# Patient Record
Sex: Female | Born: 1950 | Race: White | Hispanic: No | Smoking: Current every day smoker
Health system: Southern US, Community
[De-identification: ages and names within clinical notes are randomized; demographics above are authoritative.]

## PROBLEM LIST (undated history)

## (undated) DIAGNOSIS — F32A Depression, unspecified: Secondary | ICD-10-CM

## (undated) DIAGNOSIS — R32 Unspecified urinary incontinence: Secondary | ICD-10-CM

## (undated) DIAGNOSIS — Z8701 Personal history of pneumonia (recurrent): Secondary | ICD-10-CM

## (undated) DIAGNOSIS — F329 Major depressive disorder, single episode, unspecified: Secondary | ICD-10-CM

## (undated) DIAGNOSIS — F419 Anxiety disorder, unspecified: Secondary | ICD-10-CM

## (undated) DIAGNOSIS — R112 Nausea with vomiting, unspecified: Secondary | ICD-10-CM

## (undated) DIAGNOSIS — E119 Type 2 diabetes mellitus without complications: Secondary | ICD-10-CM

## (undated) DIAGNOSIS — I10 Essential (primary) hypertension: Secondary | ICD-10-CM

## (undated) DIAGNOSIS — K219 Gastro-esophageal reflux disease without esophagitis: Secondary | ICD-10-CM

## (undated) DIAGNOSIS — Z9889 Other specified postprocedural states: Secondary | ICD-10-CM

## (undated) DIAGNOSIS — R002 Palpitations: Secondary | ICD-10-CM

## (undated) HISTORY — DX: Depression, unspecified: F32.A

## (undated) HISTORY — DX: Unspecified urinary incontinence: R32

## (undated) HISTORY — DX: Major depressive disorder, single episode, unspecified: F32.9

---
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
## 1979-01-09 HISTORY — PX: DILATION AND CURETTAGE OF UTERUS: SHX78

## 1981-05-10 HISTORY — PX: ABDOMINAL HYSTERECTOMY: SHX81

## 2012-08-30 ENCOUNTER — Other Ambulatory Visit: Payer: Self-pay | Admitting: Neurosurgery

## 2012-09-07 ENCOUNTER — Encounter (HOSPITAL_COMMUNITY): Payer: Self-pay

## 2012-09-07 ENCOUNTER — Encounter (HOSPITAL_COMMUNITY)
Admission: RE | Admit: 2012-09-07 | Discharge: 2012-09-07 | Disposition: A | Discharge status: Home / Self Care | Payer: No Typology Code available for payment source | Source: Ambulatory Visit | Attending: Neurosurgery | Admitting: Neurosurgery

## 2012-09-07 ENCOUNTER — Ambulatory Visit (HOSPITAL_COMMUNITY)
Admission: RE | Admit: 2012-09-07 | Discharge: 2012-09-07 | Disposition: A | Discharge status: Home / Self Care | Payer: No Typology Code available for payment source | Source: Ambulatory Visit | Attending: Anesthesiology | Admitting: Anesthesiology

## 2012-09-07 ENCOUNTER — Encounter (HOSPITAL_COMMUNITY): Payer: Self-pay | Admitting: Pharmacy Technician

## 2012-09-07 DIAGNOSIS — Z87891 Personal history of nicotine dependence: Secondary | ICD-10-CM | POA: Insufficient documentation

## 2012-09-07 DIAGNOSIS — I10 Essential (primary) hypertension: Secondary | ICD-10-CM | POA: Insufficient documentation

## 2012-09-07 DIAGNOSIS — Z9119 Patient's noncompliance with other medical treatment and regimen: Secondary | ICD-10-CM | POA: Insufficient documentation

## 2012-09-07 DIAGNOSIS — Z91199 Patient's noncompliance with other medical treatment and regimen due to unspecified reason: Secondary | ICD-10-CM | POA: Insufficient documentation

## 2012-09-07 DIAGNOSIS — Z01818 Encounter for other preprocedural examination: Secondary | ICD-10-CM | POA: Insufficient documentation

## 2012-09-07 DIAGNOSIS — I77819 Aortic ectasia, unspecified site: Secondary | ICD-10-CM | POA: Insufficient documentation

## 2012-09-07 DIAGNOSIS — Z01812 Encounter for preprocedural laboratory examination: Secondary | ICD-10-CM | POA: Insufficient documentation

## 2012-09-07 HISTORY — DX: Palpitations: R00.2

## 2012-09-07 HISTORY — DX: Type 2 diabetes mellitus without complications: E11.9

## 2012-09-07 HISTORY — DX: Essential (primary) hypertension: I10

## 2012-09-07 HISTORY — DX: Personal history of pneumonia (recurrent): Z87.01

## 2012-09-07 HISTORY — DX: Anxiety disorder, unspecified: F41.9

## 2012-09-07 HISTORY — DX: Nausea with vomiting, unspecified: R11.2

## 2012-09-07 HISTORY — DX: Other specified postprocedural states: Z98.890

## 2012-09-07 HISTORY — DX: Gastro-esophageal reflux disease without esophagitis: K21.9

## 2012-09-07 LAB — SURGICAL PCR SCREEN
MRSA, PCR: NEGATIVE
Staphylococcus aureus: NEGATIVE

## 2012-09-07 LAB — CBC
MCV: 82.6 fL (ref 78.0–100.0)
Platelets: 343 10*3/uL (ref 150–400)
RBC: 5.47 MIL/uL — ABNORMAL HIGH (ref 3.87–5.11)
RDW: 13.2 % (ref 11.5–15.5)
WBC: 15.5 10*3/uL — ABNORMAL HIGH (ref 4.0–10.5)

## 2012-09-07 LAB — BASIC METABOLIC PANEL
CO2: 31 mEq/L (ref 19–32)
Calcium: 9.9 mg/dL (ref 8.4–10.5)
Creatinine, Ser: 0.99 mg/dL (ref 0.50–1.10)
GFR calc non Af Amer: 61 mL/min — ABNORMAL LOW (ref >90)
Sodium: 138 mEq/L (ref 135–145)

## 2012-09-07 NOTE — Pre-Procedure Instructions (Signed)
Katrina Schultz  09/07/2012   Your procedure is scheduled on:  May 6  Report to [facility] at 05:30 AM.  Call this number if you have problems the morning of surgery: [phone]   Remember:   Do not eat food or drink liquids after midnight.   Take these medicines the morning of surgery with A SIP OF WATER: Lorazepam (if needed); Hydrocodone (if needed) Celexa   STOP Belviq today  Do not wear jewelry, make-up or nail polish.  Do not wear lotions, powders, or perfumes. You may wear deodorant.  Do not shave 48 hours prior to surgery. Men may shave face and neck.  Do not bring valuables to the hospital.  Contacts, dentures or bridgework may not be worn into surgery.  Leave suitcase in the car. After surgery it may be brought to your room.  For patients admitted to the hospital, checkout time is 11:00 AM the day of discharge.   Special Instructions: Shower using CHG 2 nights before surgery and the night before surgery.  If you shower the day of surgery use CHG.  Use special wash - you have one bottle of CHG for all showers.  You should use approximately 1/3 of the bottle for each shower.   Please read over the following fact sheets that you were given: Pain Booklet, Coughing and Deep Breathing and Surgical Site Infection Prevention

## 2012-09-07 NOTE — Progress Notes (Signed)
Requested Stress test, EKG, Echo, OV from Dr. Earna Coder cardiologist [place], [place]

## 2012-09-08 NOTE — Progress Notes (Signed)
Anesthesia Chart Review:  Patient is a 62 year old female scheduled for C4-5, C5-6, C6-7 ACDF on 09/12/12.  History includes former smoker, DM2, HTN, anxiety, GERD, palpitations, PNA, tonsillectomy, hysterectomy, hard of hearing (lip reads very proficiently per PAT RN notes).    She was evaluated by Cardiologist is Dr. Phylliss Bob in [place], [place] in late 2013 for chest pain.  She had a normal stress echo on 04/10/12, and continued medical therapy was recommended.  Echo on 04/05/12 showed normal LV function, EF 60%, normal RV function, mild TR.  Holter in 03/30/12 showed NSR with PACs.    EKG on 03/27/12 (Dr. Rockne Menghini) showed NSR, borderline LAD.  CXR on 09/07/12 showed no acute cardiopulmonary or pleural abnormalities seen.  Preoperative labs noted.  WBC elevated at 15.5K.  Cr 0.99, glucose 166. I called WBC result to Erie Noe at Dr. Trudee Grip office.  I will order a repeat CBC with differential for the day of surgery.  Erie Noe will let me know if Dr. Beverlyn Roux has any additional pre-operative order recommendations.  If her follow-up WBC is stable/improved then I would anticipate she could proceed as planned.   Velna Ochs [facility]/Anesthesiology Phone [phone] 09/08/2012 11:42 AM

## 2012-09-11 MED ORDER — CEFAZOLIN SODIUM-DEXTROSE 2-3 GM-% IV SOLR
2.0000 g | INTRAVENOUS | Status: DC
  Filled 2012-09-11: qty 50

## 2012-09-11 MED ORDER — DEXAMETHASONE SODIUM PHOSPHATE 10 MG/ML IJ SOLN
10.0000 mg | INTRAMUSCULAR | Status: AC
  Administered 2012-09-12: 10 mg via INTRAVENOUS
  Filled 2012-09-11: qty 1

## 2012-09-12 ENCOUNTER — Encounter (HOSPITAL_COMMUNITY): Payer: Self-pay | Admitting: *Deleted

## 2012-09-12 ENCOUNTER — Inpatient Hospital Stay (HOSPITAL_COMMUNITY)
Admission: RE | Admit: 2012-09-12 | Discharge: 2012-09-14 | DRG: 472 | Disposition: A | Discharge status: Home / Self Care | Payer: No Typology Code available for payment source | Source: Ambulatory Visit | Attending: Neurosurgery | Admitting: Neurosurgery

## 2012-09-12 ENCOUNTER — Encounter (HOSPITAL_COMMUNITY): Payer: Self-pay | Admitting: Vascular Surgery

## 2012-09-12 ENCOUNTER — Inpatient Hospital Stay (HOSPITAL_COMMUNITY): Payer: No Typology Code available for payment source

## 2012-09-12 ENCOUNTER — Inpatient Hospital Stay (HOSPITAL_COMMUNITY): Payer: No Typology Code available for payment source | Admitting: Anesthesiology

## 2012-09-12 ENCOUNTER — Encounter (HOSPITAL_COMMUNITY)
Admission: RE | Disposition: A | Discharge status: Home / Self Care | Payer: Self-pay | Source: Ambulatory Visit | Attending: Neurosurgery

## 2012-09-12 DIAGNOSIS — M4802 Spinal stenosis, cervical region: Secondary | ICD-10-CM

## 2012-09-12 DIAGNOSIS — Z87891 Personal history of nicotine dependence: Secondary | ICD-10-CM

## 2012-09-12 DIAGNOSIS — F411 Generalized anxiety disorder: Secondary | ICD-10-CM | POA: Diagnosis present

## 2012-09-12 DIAGNOSIS — I10 Essential (primary) hypertension: Secondary | ICD-10-CM | POA: Diagnosis present

## 2012-09-12 DIAGNOSIS — Z794 Long term (current) use of insulin: Secondary | ICD-10-CM

## 2012-09-12 DIAGNOSIS — M47812 Spondylosis without myelopathy or radiculopathy, cervical region: Principal | ICD-10-CM | POA: Diagnosis present

## 2012-09-12 DIAGNOSIS — K219 Gastro-esophageal reflux disease without esophagitis: Secondary | ICD-10-CM | POA: Diagnosis present

## 2012-09-12 DIAGNOSIS — N390 Urinary tract infection, site not specified: Secondary | ICD-10-CM | POA: Diagnosis present

## 2012-09-12 DIAGNOSIS — E119 Type 2 diabetes mellitus without complications: Secondary | ICD-10-CM | POA: Diagnosis present

## 2012-09-12 HISTORY — PX: ANTERIOR CERVICAL DECOMP/DISCECTOMY FUSION: SHX1161

## 2012-09-12 LAB — DIFFERENTIAL
Basophils Absolute: 0 10*3/uL (ref 0.0–0.1)
Basophils Relative: 0 % (ref 0–1)
Lymphocytes Relative: 35 % (ref 12–46)
Monocytes Relative: 10 % (ref 3–12)
Neutro Abs: 5.5 10*3/uL (ref 1.7–7.7)
Neutrophils Relative %: 53 % (ref 43–77)

## 2012-09-12 LAB — GLUCOSE, CAPILLARY
Glucose-Capillary: 212 mg/dL — ABNORMAL HIGH (ref 70–99)
Glucose-Capillary: 243 mg/dL — ABNORMAL HIGH (ref 70–99)
Glucose-Capillary: 276 mg/dL — ABNORMAL HIGH (ref 70–99)
Glucose-Capillary: 282 mg/dL — ABNORMAL HIGH (ref 70–99)
Glucose-Capillary: 241 mg/dL — ABNORMAL HIGH (ref 70–99)

## 2012-09-12 LAB — CBC
HCT: 41.1 % (ref 36.0–46.0)
Hemoglobin: 14.7 g/dL (ref 12.0–15.0)
MCHC: 35.8 g/dL (ref 30.0–36.0)
RDW: 13.1 % (ref 11.5–15.5)
WBC: 10.3 10*3/uL (ref 4.0–10.5)

## 2012-09-12 SURGERY — ANTERIOR CERVICAL DECOMPRESSION/DISCECTOMY FUSION 3 LEVELS
Anesthesia: General | Wound class: Clean

## 2012-09-12 MED ORDER — MENTHOL 3 MG MT LOZG
1.0000 | LOZENGE | OROMUCOSAL | Status: DC | PRN
  Filled 2012-09-12: qty 9

## 2012-09-12 MED ORDER — HEMOSTATIC AGENTS (NO CHARGE) OPTIME
TOPICAL | Status: DC | PRN
  Administered 2012-09-12: 1 via TOPICAL

## 2012-09-12 MED ORDER — BACITRACIN 50000 UNITS IM SOLR
INTRAMUSCULAR | Status: AC
  Filled 2012-09-12: qty 1

## 2012-09-12 MED ORDER — LORAZEPAM 0.5 MG PO TABS: 0.5000 mg | ORAL_TABLET | Freq: Four times a day (QID) | ORAL | Status: DC | PRN

## 2012-09-12 MED ORDER — SODIUM CHLORIDE 0.9 % IV SOLN
INTRAVENOUS | Status: AC
  Filled 2012-09-12: qty 500

## 2012-09-12 MED ORDER — FENTANYL CITRATE 0.05 MG/ML IJ SOLN
INTRAMUSCULAR | Status: DC | PRN
  Administered 2012-09-12: 50 ug via INTRAVENOUS
  Administered 2012-09-12 (×3): 100 ug via INTRAVENOUS
  Administered 2012-09-12: 50 ug via INTRAVENOUS

## 2012-09-12 MED ORDER — OXYCODONE HCL 5 MG/5ML PO SOLN: 5.0000 mg | Freq: Once | ORAL | Status: DC | PRN

## 2012-09-12 MED ORDER — SIMVASTATIN 5 MG PO TABS
5.0000 mg | ORAL_TABLET | Freq: Every day | ORAL | Status: DC
  Filled 2012-09-12 (×3): qty 1

## 2012-09-12 MED ORDER — SODIUM CHLORIDE 0.9 % IR SOLN
Status: DC | PRN
  Administered 2012-09-12: 08:00:00

## 2012-09-12 MED ORDER — INSULIN ASPART 100 UNIT/ML ~~LOC~~ SOLN
4.0000 [IU] | Freq: Three times a day (TID) | SUBCUTANEOUS | Status: DC
  Administered 2012-09-12 – 2012-09-13 (×3): 4 [IU] via SUBCUTANEOUS

## 2012-09-12 MED ORDER — HYDROMORPHONE HCL PF 1 MG/ML IJ SOLN
INTRAMUSCULAR | Status: AC
  Filled 2012-09-12: qty 2

## 2012-09-12 MED ORDER — THROMBIN 20000 UNITS EX SOLR
CUTANEOUS | Status: DC | PRN
  Administered 2012-09-12: 07:00:00 via TOPICAL

## 2012-09-12 MED ORDER — SODIUM CHLORIDE 0.9 % IJ SOLN: 3.0000 mL | INTRAMUSCULAR | Status: DC | PRN

## 2012-09-12 MED ORDER — ACETAMINOPHEN 325 MG PO TABS: 650.0000 mg | ORAL_TABLET | ORAL | Status: DC | PRN

## 2012-09-12 MED ORDER — METFORMIN HCL ER 500 MG PO TB24
1000.0000 mg | ORAL_TABLET | Freq: Every day | ORAL | Status: DC
  Administered 2012-09-12 – 2012-09-13 (×2): 1000 mg via ORAL
  Filled 2012-09-12 (×3): qty 2

## 2012-09-12 MED ORDER — INSULIN ASPART 100 UNIT/ML ~~LOC~~ SOLN
0.0000 [IU] | Freq: Three times a day (TID) | SUBCUTANEOUS | Status: DC
  Administered 2012-09-12 (×2): 11 [IU] via SUBCUTANEOUS
  Administered 2012-09-13: 7 [IU] via SUBCUTANEOUS
  Administered 2012-09-13 (×2): 4 [IU] via SUBCUTANEOUS
  Administered 2012-09-14: 3 [IU] via SUBCUTANEOUS

## 2012-09-12 MED ORDER — PROMETHAZINE HCL 25 MG/ML IJ SOLN: 6.2500 mg | INTRAMUSCULAR | Status: DC | PRN

## 2012-09-12 MED ORDER — SODIUM CHLORIDE 0.9 % IJ SOLN
3.0000 mL | Freq: Two times a day (BID) | INTRAMUSCULAR | Status: DC
  Administered 2012-09-12 – 2012-09-14 (×4): 3 mL via INTRAVENOUS

## 2012-09-12 MED ORDER — LORCASERIN HCL 10 MG PO TABS: 1.0000 | ORAL_TABLET | Freq: Two times a day (BID) | ORAL | Status: DC

## 2012-09-12 MED ORDER — ONDANSETRON HCL 4 MG/2ML IJ SOLN
4.0000 mg | INTRAMUSCULAR | Status: DC | PRN
  Administered 2012-09-12 – 2012-09-13 (×5): 4 mg via INTRAVENOUS
  Filled 2012-09-12 (×4): qty 2

## 2012-09-12 MED ORDER — SUCCINYLCHOLINE CHLORIDE 20 MG/ML IJ SOLN
INTRAMUSCULAR | Status: DC | PRN
  Administered 2012-09-12: 100 mg via INTRAVENOUS

## 2012-09-12 MED ORDER — OXYCODONE HCL 5 MG PO TABS: 5.0000 mg | ORAL_TABLET | Freq: Once | ORAL | Status: DC | PRN

## 2012-09-12 MED ORDER — ARTIFICIAL TEARS OP OINT
TOPICAL_OINTMENT | OPHTHALMIC | Status: DC | PRN
  Administered 2012-09-12: 1 via OPHTHALMIC

## 2012-09-12 MED ORDER — HYDROCHLOROTHIAZIDE 12.5 MG PO CAPS
12.5000 mg | ORAL_CAPSULE | Freq: Every day | ORAL | Status: DC
  Administered 2012-09-12 – 2012-09-13 (×2): 12.5 mg via ORAL
  Filled 2012-09-12 (×3): qty 1

## 2012-09-12 MED ORDER — PROPOFOL 10 MG/ML IV BOLUS
INTRAVENOUS | Status: DC | PRN
  Administered 2012-09-12: 200 mg via INTRAVENOUS

## 2012-09-12 MED ORDER — LISINOPRIL-HYDROCHLOROTHIAZIDE 10-12.5 MG PO TABS: 1.0000 | ORAL_TABLET | Freq: Every day | ORAL | Status: DC

## 2012-09-12 MED ORDER — PHENOL 1.4 % MT LIQD
1.0000 | OROMUCOSAL | Status: DC | PRN
  Administered 2012-09-13: 1 via OROMUCOSAL
  Filled 2012-09-12: qty 177

## 2012-09-12 MED ORDER — ONDANSETRON HCL 4 MG/2ML IJ SOLN
INTRAMUSCULAR | Status: DC | PRN
  Administered 2012-09-12 (×2): 4 mg via INTRAVENOUS

## 2012-09-12 MED ORDER — LIDOCAINE HCL (CARDIAC) 20 MG/ML IV SOLN
INTRAVENOUS | Status: DC | PRN
  Administered 2012-09-12: 100 mg via INTRAVENOUS
  Administered 2012-09-12: 30 mg via INTRAVENOUS

## 2012-09-12 MED ORDER — EPHEDRINE SULFATE 50 MG/ML IJ SOLN
INTRAMUSCULAR | Status: DC | PRN
  Administered 2012-09-12: 10 mg via INTRAVENOUS
  Administered 2012-09-12 (×2): 5 mg via INTRAVENOUS

## 2012-09-12 MED ORDER — MEPERIDINE HCL 25 MG/ML IJ SOLN: 6.2500 mg | INTRAMUSCULAR | Status: DC | PRN

## 2012-09-12 MED ORDER — ONDANSETRON HCL 4 MG/2ML IJ SOLN
INTRAMUSCULAR | Status: AC
  Filled 2012-09-12: qty 2

## 2012-09-12 MED ORDER — CITALOPRAM HYDROBROMIDE 20 MG PO TABS
20.0000 mg | ORAL_TABLET | Freq: Every day | ORAL | Status: DC
  Administered 2012-09-12 – 2012-09-13 (×2): 20 mg via ORAL
  Filled 2012-09-12 (×3): qty 1

## 2012-09-12 MED ORDER — CYCLOBENZAPRINE HCL 10 MG PO TABS
10.0000 mg | ORAL_TABLET | Freq: Three times a day (TID) | ORAL | Status: DC | PRN
  Administered 2012-09-13 – 2012-09-14 (×2): 10 mg via ORAL
  Filled 2012-09-12 (×4): qty 1

## 2012-09-12 MED ORDER — INSULIN ASPART 100 UNIT/ML ~~LOC~~ SOLN
4.0000 [IU] | Freq: Once | SUBCUTANEOUS | Status: AC
  Administered 2012-09-12: 4 [IU] via SUBCUTANEOUS

## 2012-09-12 MED ORDER — ACETAMINOPHEN 650 MG RE SUPP: 650.0000 mg | RECTAL | Status: DC | PRN

## 2012-09-12 MED ORDER — SODIUM CHLORIDE 0.9 % IV SOLN: 250.0000 mL | INTRAVENOUS | Status: DC

## 2012-09-12 MED ORDER — HYDROMORPHONE HCL PF 1 MG/ML IJ SOLN: 0.2500 mg | INTRAMUSCULAR | Status: DC | PRN

## 2012-09-12 MED ORDER — VANCOMYCIN HCL IN DEXTROSE 1-5 GM/200ML-% IV SOLN
1000.0000 mg | Freq: Two times a day (BID) | INTRAVENOUS | Status: DC
  Administered 2012-09-12 – 2012-09-14 (×4): 1000 mg via INTRAVENOUS
  Filled 2012-09-12 (×6): qty 200

## 2012-09-12 MED ORDER — VANCOMYCIN HCL IN DEXTROSE 1-5 GM/200ML-% IV SOLN
INTRAVENOUS | Status: AC
  Administered 2012-09-12: 1000 mg via INTRAVENOUS
  Filled 2012-09-12: qty 200

## 2012-09-12 MED ORDER — LISINOPRIL 10 MG PO TABS
10.0000 mg | ORAL_TABLET | Freq: Every day | ORAL | Status: DC
  Administered 2012-09-12: 10 mg via ORAL
  Filled 2012-09-12 (×3): qty 1

## 2012-09-12 MED ORDER — HYDROMORPHONE HCL PF 1 MG/ML IJ SOLN
1.0000 mg | INTRAMUSCULAR | Status: DC | PRN
  Administered 2012-09-12 – 2012-09-13 (×3): 1.5 mg via INTRAMUSCULAR
  Filled 2012-09-12 (×2): qty 2

## 2012-09-12 MED ORDER — VANCOMYCIN HCL IN DEXTROSE 1-5 GM/200ML-% IV SOLN: 1000.0000 mg | Freq: Once | INTRAVENOUS | Status: DC

## 2012-09-12 MED ORDER — ROCURONIUM BROMIDE 100 MG/10ML IV SOLN
INTRAVENOUS | Status: DC | PRN
  Administered 2012-09-12: 30 mg via INTRAVENOUS
  Administered 2012-09-12: 50 mg via INTRAVENOUS

## 2012-09-12 MED ORDER — NEOSTIGMINE METHYLSULFATE 1 MG/ML IJ SOLN
INTRAMUSCULAR | Status: DC | PRN
  Administered 2012-09-12: 4 mg via INTRAVENOUS

## 2012-09-12 MED ORDER — GLYCOPYRROLATE 0.2 MG/ML IJ SOLN
INTRAMUSCULAR | Status: DC | PRN
  Administered 2012-09-12: 0.6 mg via INTRAVENOUS

## 2012-09-12 MED ORDER — LACTATED RINGERS IV SOLN
INTRAVENOUS | Status: DC | PRN
  Administered 2012-09-12 (×3): via INTRAVENOUS

## 2012-09-12 MED ORDER — INSULIN ASPART 100 UNIT/ML ~~LOC~~ SOLN
SUBCUTANEOUS | Status: AC
  Filled 2012-09-12: qty 1

## 2012-09-12 MED ORDER — MIDAZOLAM HCL 5 MG/5ML IJ SOLN
INTRAMUSCULAR | Status: DC | PRN
  Administered 2012-09-12 (×2): 1 mg via INTRAVENOUS

## 2012-09-12 MED ORDER — HYDROCODONE-ACETAMINOPHEN 5-325 MG PO TABS
1.0000 | ORAL_TABLET | ORAL | Status: DC | PRN
  Administered 2012-09-13 – 2012-09-14 (×4): 2 via ORAL
  Filled 2012-09-12 (×5): qty 2

## 2012-09-12 MED ORDER — DEXAMETHASONE SODIUM PHOSPHATE 4 MG/ML IJ SOLN
4.0000 mg | Freq: Four times a day (QID) | INTRAMUSCULAR | Status: AC
  Administered 2012-09-12: 4 mg via INTRAVENOUS
  Filled 2012-09-12: qty 1

## 2012-09-12 MED ORDER — DEXAMETHASONE 4 MG PO TABS
4.0000 mg | ORAL_TABLET | Freq: Four times a day (QID) | ORAL | Status: AC
  Administered 2012-09-12: 4 mg via ORAL
  Filled 2012-09-12: qty 1

## 2012-09-12 MED ORDER — INSULIN ASPART 100 UNIT/ML ~~LOC~~ SOLN
0.0000 [IU] | Freq: Every day | SUBCUTANEOUS | Status: DC
  Administered 2012-09-12 – 2012-09-13 (×2): 2 [IU] via SUBCUTANEOUS

## 2012-09-12 MED ORDER — THROMBIN 5000 UNITS EX SOLR
CUTANEOUS | Status: DC | PRN
  Administered 2012-09-12: 5000 [IU] via TOPICAL

## 2012-09-12 MED ORDER — POTASSIUM CHLORIDE IN NACL 20-0.45 MEQ/L-% IV SOLN
INTRAVENOUS | Status: DC
  Filled 2012-09-12 (×5): qty 1000

## 2012-09-12 MED ORDER — DEXTROSE 5 % IV SOLN
INTRAVENOUS | Status: DC | PRN
  Administered 2012-09-12: 08:00:00 via INTRAVENOUS

## 2012-09-12 MED ORDER — 0.9 % SODIUM CHLORIDE (POUR BTL) OPTIME
TOPICAL | Status: DC | PRN
  Administered 2012-09-12: 1000 mL

## 2012-09-12 MED ORDER — VANCOMYCIN HCL 10 G IV SOLR
1500.0000 mg | Freq: Once | INTRAVENOUS | Status: DC
  Filled 2012-09-12: qty 1500

## 2012-09-12 SURGICAL SUPPLY — 65 items
BAG DECANTER FOR FLEXI CONT (MISCELLANEOUS) ×2 IMPLANT
BENZOIN TINCTURE PRP APPL 2/3 (GAUZE/BANDAGES/DRESSINGS) ×2 IMPLANT
BIT DRILL INVIZIA (BIT) ×1 IMPLANT
BRUSH SCRUB EZ PLAIN DRY (MISCELLANEOUS) ×2 IMPLANT
BUR MATCHSTICK NEURO 3.0 LAGG (BURR) ×2 IMPLANT
CANISTER SUCTION 2500CC (MISCELLANEOUS) ×2 IMPLANT
CLOTH BEACON ORANGE TIMEOUT ST (SAFETY) ×2 IMPLANT
CONT SPEC 4OZ CLIKSEAL STRL BL (MISCELLANEOUS) ×2 IMPLANT
DRAIN JACKSON PRATT 1/4 1325 (MISCELLANEOUS) ×2 IMPLANT
DRAPE C-ARM 42X72 X-RAY (DRAPES) ×4 IMPLANT
DRAPE LAPAROTOMY 100X72 PEDS (DRAPES) ×2 IMPLANT
DRAPE MICROSCOPE LEICA (MISCELLANEOUS) ×2 IMPLANT
DRAPE POUCH INSTRU U-SHP 10X18 (DRAPES) ×2 IMPLANT
DRAPE SURG 17X23 STRL (DRAPES) ×4 IMPLANT
DRESSING TELFA 8X3 (GAUZE/BANDAGES/DRESSINGS) ×2 IMPLANT
DRILL BIT INVIZIA (BIT) ×2
DURAPREP 6ML APPLICATOR 50/CS (WOUND CARE) ×2 IMPLANT
ELECT COATED BLADE 2.86 ST (ELECTRODE) ×4 IMPLANT
ELECT REM PT RETURN 9FT ADLT (ELECTROSURGICAL) ×2
ELECTRODE REM PT RTRN 9FT ADLT (ELECTROSURGICAL) ×1 IMPLANT
EVACUATOR SILICONE 100CC (DRAIN) ×2 IMPLANT
GAUZE SPONGE 4X4 16PLY XRAY LF (GAUZE/BANDAGES/DRESSINGS) IMPLANT
GLOVE BIOGEL M 8.0 STRL (GLOVE) ×2 IMPLANT
GLOVE BIOGEL PI IND STRL 8 (GLOVE) ×3 IMPLANT
GLOVE BIOGEL PI INDICATOR 8 (GLOVE) ×3
GLOVE ECLIPSE 7.5 STRL STRAW (GLOVE) ×2 IMPLANT
GLOVE EXAM NITRILE LRG STRL (GLOVE) ×4 IMPLANT
GLOVE EXAM NITRILE XL STR (GLOVE) IMPLANT
GLOVE EXAM NITRILE XS STR PU (GLOVE) IMPLANT
GLOVE INDICATOR 8.5 STRL (GLOVE) ×4 IMPLANT
GOWN BRE IMP SLV AUR LG STRL (GOWN DISPOSABLE) ×2 IMPLANT
GOWN BRE IMP SLV AUR XL STRL (GOWN DISPOSABLE) ×2 IMPLANT
GOWN STRL REIN 2XL LVL4 (GOWN DISPOSABLE) ×4 IMPLANT
HEAD HALTER (SOFTGOODS) ×2 IMPLANT
HEMOSTAT POWDER KIT SURGIFOAM (HEMOSTASIS) ×2 IMPLANT
INTERBODY TM 11X14X5-7DEG ANG (Metal Cage) ×2 IMPLANT
INTERBODY TM 11X14X8-7DEG ANG (Metal Cage) ×2 IMPLANT
KIT BASIN OR (CUSTOM PROCEDURE TRAY) ×2 IMPLANT
KIT ROOM TURNOVER OR (KITS) ×2 IMPLANT
NEEDLE SPNL 20GX3.5 QUINCKE YW (NEEDLE) ×2 IMPLANT
NS IRRIG 1000ML POUR BTL (IV SOLUTION) ×2 IMPLANT
PACK LAMINECTOMY NEURO (CUSTOM PROCEDURE TRAY) ×2 IMPLANT
PAD ARMBOARD 7.5X6 YLW CONV (MISCELLANEOUS) ×4 IMPLANT
PATTIES SURGICAL .25X.25 (GAUZE/BANDAGES/DRESSINGS) IMPLANT
PATTIES SURGICAL .75X.75 (GAUZE/BANDAGES/DRESSINGS) ×2 IMPLANT
PLATE INVIZIA 3 LEV 57MM (Plate) ×2 IMPLANT
PUTTY BONE GRAFT KIT 2.5ML (Bone Implant) ×2 IMPLANT
RUBBERBAND STERILE (MISCELLANEOUS) ×4 IMPLANT
SCREW SD FIXED 12MM (Screw) ×8 IMPLANT
SCREW SELF DRILL VAR 12MM (Screw) ×8 IMPLANT
SPACER TMS 11X14X6MM (Spacer) ×2 IMPLANT
SPONGE GAUZE 4X4 12PLY (GAUZE/BANDAGES/DRESSINGS) ×2 IMPLANT
SPONGE INTESTINAL PEANUT (DISPOSABLE) ×4 IMPLANT
SPONGE SURGIFOAM ABS GEL 100 (HEMOSTASIS) ×2 IMPLANT
STRIP CLOSURE SKIN 1/2X4 (GAUZE/BANDAGES/DRESSINGS) ×2 IMPLANT
STRIP CLOSURE SKIN 1/4X4 (GAUZE/BANDAGES/DRESSINGS) ×2 IMPLANT
SUT PDS AB 5-0 P3 18 (SUTURE) ×2 IMPLANT
SUT VIC AB 3-0 CP2 18 (SUTURE) ×4 IMPLANT
SYR 20ML ECCENTRIC (SYRINGE) IMPLANT
SYR BULB IRRIGATION 50ML (SYRINGE) ×2 IMPLANT
TOWEL OR 17X24 6PK STRL BLUE (TOWEL DISPOSABLE) ×2 IMPLANT
TOWEL OR 17X26 10 PK STRL BLUE (TOWEL DISPOSABLE) ×2 IMPLANT
TRAP SPECIMEN MUCOUS 40CC (MISCELLANEOUS) ×2 IMPLANT
TRAY FOLEY CATH 14FR (SET/KITS/TRAYS/PACK) ×2 IMPLANT
WATER STERILE IRR 1000ML POUR (IV SOLUTION) ×2 IMPLANT

## 2012-09-12 NOTE — Transfer of Care (Signed)
Immediate Anesthesia Transfer of Care Note  Patient: Katrina Schultz  Procedure(s) Performed: Procedure(s) with comments: ANTERIOR CERVICAL DECOMPRESSION/DISCECTOMY FUSION 3 LEVELS (N/A) - ANTERIOR CERVICAL FOUR-FIVE,FIVE-SIX,SIX SEVEN  Patient Location: PACU  Anesthesia Type:General  Level of Consciousness: awake, alert , oriented, patient cooperative and responds to stimulation  Airway & Oxygen Therapy: Patient Spontanous Breathing and Patient connected to nasal cannula oxygen  Post-op Assessment: Report given to PACU RN, Post -op Vital signs reviewed and stable and Patient moving all extremities X 4  Post vital signs: Reviewed, stable  Complications: No apparent anesthesia complications

## 2012-09-12 NOTE — Preoperative (Signed)
Beta Blockers   Reason not to administer Beta Blockers:Not Applicable 

## 2012-09-12 NOTE — H&P (Signed)
Katrina Schultz is an 62 y.o. female.   Chief Complaint: Left arm pain HPI: The patient is a 62 year old female who was evaluated in the office for left arm pain of 4 weeks' duration. She's got of bed noticed the pain. She's tried a few times by chiropractor without improvement and x-rays were done. She saw her medical Dr. an MRI scan and tried on Percocet and Skelaxin without relief. She was then referred for evaluation. Once it out as well as astigmatic. She did note some weakness of the left arm. Evaluation of his used for additional conservative therapy with steroids and this gave her no relief. After discussing options the patient requested surgery now comes for a three-level anterior cervical discectomy with fusion and plating. I've had a long discussion with her regarding the risks and benefits of surgical intervention. The risks discussed include but are not limited to bleeding infection weakness and paralysis spinal fluid leak coma quadriplegia hoarseness and death. We have discussed alternative methods of therapy offered risks and benefits of nonintervention. She's had the opportunity to rest numerous questions and appears to understand. With this information in hand she has requested that we proceed with surgery.  Past Medical History  Diagnosis Date  . PONV (postoperative nausea and vomiting)   . Heart palpitations   . Anxiety   . Diabetes mellitus without complication   . GERD (gastroesophageal reflux disease)   . Hypertension     Seen Dr. Earna Coder in [place]  . History of pneumonia     Past Surgical History  Procedure Laterality Date  . Dilation and curettage of uterus  1980's  . Abdominal hysterectomy  1983  . Tonsillectomy  1970    History reviewed. No pertinent family history. Social History:  reports that she quit smoking about 3 years ago. Her smoking use included Cigarettes. She smoked 0.00 packs per day. She has never used smokeless tobacco. She reports that  drinks alcohol.  She reports that she does not use illicit drugs.  Allergies:  Allergies  Allergen Reactions  . Erythromycin Nausea And Vomiting  . Penicillins Rash    Medications Prior to Admission  Medication Sig Dispense Refill  . citalopram (CELEXA) 20 MG tablet Take 20 mg by mouth at bedtime.      Marland Kitchen HYDROcodone-acetaminophen (NORCO/VICODIN) 5-325 MG per tablet Take 1 tablet by mouth every 4 (four) hours as needed for pain. For pain      . Lactobacillus (ACIDOPHILUS PO) Take 1 tablet by mouth at bedtime.      Marland Kitchen lisinopril-hydrochlorothiazide (PRINZIDE,ZESTORETIC) 10-12.5 MG per tablet Take 1 tablet by mouth at bedtime.      Marland Kitchen LORazepam (ATIVAN) 0.5 MG tablet Take 0.5 mg by mouth 4 (four) times daily as needed for anxiety. For anxiety      . Lorcaserin HCl (BELVIQ) 10 MG TABS Take 1 tablet by mouth 2 (two) times daily.      . metFORMIN (GLUMETZA) 1000 MG (MOD) 24 hr tablet Take 1,000 mg by mouth at bedtime.      . pravastatin (PRAVACHOL) 40 MG tablet Take 40 mg by mouth at bedtime.        Results for orders placed during the hospital encounter of 09/12/12 (from the past 48 hour(s))  GLUCOSE, CAPILLARY     Status: Abnormal   Collection Time    09/12/12  6:27 AM      Result Value Range   Glucose-Capillary 212 (*) 70 - 99 mg/dL  CBC  Status: None   Collection Time    09/12/12  6:36 AM      Result Value Range   WBC 10.3  4.0 - 10.5 K/uL   RBC 5.08  3.87 - 5.11 MIL/uL   Hemoglobin 14.7  12.0 - 15.0 g/dL   HCT 40.9  81.1 - 91.4 %   MCV 80.9  78.0 - 100.0 fL   MCH 28.9  26.0 - 34.0 pg   MCHC 35.8  30.0 - 36.0 g/dL   RDW 78.2  95.6 - 21.3 %   Platelets 245  150 - 400 K/uL  DIFFERENTIAL     Status: None   Collection Time    09/12/12  6:36 AM      Result Value Range   Neutrophils Relative 53  43 - 77 %   Neutro Abs 5.5  1.7 - 7.7 K/uL   Lymphocytes Relative 35  12 - 46 %   Lymphs Abs 3.6  0.7 - 4.0 K/uL   Monocytes Relative 10  3 - 12 %   Monocytes Absolute 1.0  0.1 - 1.0 K/uL    Eosinophils Relative 2  0 - 5 %   Eosinophils Absolute 0.2  0.0 - 0.7 K/uL   Basophils Relative 0  0 - 1 %   Basophils Absolute 0.0  0.0 - 0.1 K/uL   No results found.  Positive for urinary tract infections as well as appearing loss  There were no vitals taken for this visit.  The patient is awake alert and oriented. Her gait is nonantalgic. She is no facial asymmetry. She is slightly decreased triceps reflex and marked decreased left triceps strength with decreased sensation left index and middle fingers. Correlation bouncer good. Assessment/Plan She has marked stenosis at C4-5 and C5-6 as well as foraminal stenosis at C6-7 on the left is likely related to her left arm pain and weakness. The plan is for a three-level anterior cervical discectomy with fusion and plating.  Reinaldo Meeker, MD 09/12/2012, 7:33 AM

## 2012-09-12 NOTE — Anesthesia Postprocedure Evaluation (Signed)
  Anesthesia Post-op Note  Patient: Katrina Schultz  Procedure(s) Performed: Procedure(s) with comments: ANTERIOR CERVICAL DECOMPRESSION/DISCECTOMY FUSION 3 LEVELS (N/A) - ANTERIOR CERVICAL FOUR-FIVE,FIVE-SIX,SIX SEVEN  Patient Location: PACU  Anesthesia Type:General  Level of Consciousness: awake and alert   Airway and Oxygen Therapy: Patient Spontanous Breathing  Post-op Pain: mild  Post-op Assessment: Post-op Vital signs reviewed  Post-op Vital Signs: stable  Complications: No apparent anesthesia complications

## 2012-09-12 NOTE — Op Note (Signed)
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 Jeral Fruit  After being placed in the supine position and 10 pounds halter traction the patient's neck was prepped and draped in usual sterile fashion. Localizing fluoroscopy was used prior to incision to identify the appropriate levels. Linear incision was made along the border between the strap muscles medially and the sternal cleidomastoid laterally and a vertical type incision. The incision was carried down through the platysma and subcutaneous fat until the plane between the strap muscles medially and the sternal cleidomastoid laterally could be identified and dissected free down to the anterior aspect of the cervical spine. The longus coli muscles were identified and split the midline and stripped away bilaterally with unipolar coagulation and Kitner dissection. Self-retaining tract was placed for exposure and x-ray showed approach the appropriate levels. Using a 15 blade the annulus the disc at C4-5 C5-6 and C6-7 was incised. Using pituitary rongeurs and curettes approximately 90% of the disc material was removed all 3 levels. High-speed drill was used to widen the interspace all 3 levels and bony shavings were saved for use later in the case. At this time the microscope was draped brought into the field and used for the remainder of the case. Starting C6-7 the remainder of the disc material down the posterior longitudinal ligament was removed. Ligament was then incised transversely and the cut edges removed a Kerrison punch. Thorough decompression was carried out on the spinal dura. On exploration of the C6-7 foramen on the left, large amounts of herniated disc material were  identified compressing the C7 nerve root and these were removed in a piecemeal fashion until the nerve root was well visualized well decompressed. At this time inspection was carried out this level for any evidence of residual compression and none could be identified. Attention was then turned to C5-6 once again residual bone down to the posterior longitudinal ligament was removed. Ligament was then incised and the cut edges removed a Kerrison punch. Bony overgrowth and herniated disc material causing central spinal stenosis was removed to decompress the spinal dura to the proximal foramen bilaterally, particularly towards the left symptomatic side. Once again, inspection was carried out this level for any evidence of residual compression and none could be identified. This was then turned to C4-5 once again the central herniated disc material and bony overgrowth removed to decompress the spinal dura and relieve the central stenosis. We then explored the proximal foramen bilaterally particularly on the left spermatic side. At this time attention was turned was more to evaluate all 3 levels for any evidence of residual compression and none could be identified. Irrigation was carried out and any bleeding control proper coagulation and Gelfoam. Measurements were taken and a 5 mm 6 mm and 8 mm lordotic trabecular metal graft was chosen and filled a mixture of autologous bone morselized allograft. 5 mm graft was placed at C4-5-6  Placed at C5-6 and a 9 mm a milligrams placed at C6-7. An appropriately length in VISI a anterior cervical plate was then chosen. Under fluoroscopic guidance drill holes were placed followed by placing of 14 worse October screws x8. Variable-angle screws were used at the top and bottom extrusion or use of the middle. Locking mechanism was rotated locked position final fossae showed good position of the  grafts plate and screws. Irrigation was carried out and any bleeding control proper  coagulation. A 7 mm Jackson-Pratt drain was left in the prevertebral space and brought through separate stab incision. The was then closed with 3-0 Vicryl on the subcutaneous fat and subcuticular layer. An Steri-Strips were placed on the skin. Shortness was then applied and the patient was extubated and taken to recovery room in stable condition.

## 2012-09-12 NOTE — Progress Notes (Signed)
0630   Tried calling Dr. Gerlene Fee regarding antibiotic ordered.  Pt allergic to PCN--(rash) and order for Cefazolin 2 gms...unable to get a hold of Dr. Gerlene Fee [phone])  DA

## 2012-09-12 NOTE — Anesthesia Preprocedure Evaluation (Addendum)
Anesthesia Evaluation  Patient identified by MRN, date of birth, ID band Patient awake    Reviewed: Allergy & Precautions, H&P , NPO status , Patient's Chart, lab work & pertinent test results  History of Anesthesia Complications (+) PONV  Airway Mallampati: III  Neck ROM: Limited  Mouth opening: Limited Mouth Opening  Dental  (+) Teeth Intact, Dental Advisory Given and Missing,    Pulmonary neg pulmonary ROS,  breath sounds clear to auscultation        Cardiovascular hypertension, Pt. on medications Rhythm:Regular Rate:Normal     Neuro/Psych Anxiety negative neurological ROS     GI/Hepatic GERD-  Controlled,  Endo/Other  diabetes, Oral Hypoglycemic AgentsMorbid obesity  Renal/GU negative Renal ROS     Musculoskeletal   Abdominal (+) + obese,   Peds  Hematology   Anesthesia Other Findings   Reproductive/Obstetrics                         Anesthesia Physical Anesthesia Plan  ASA: III  Anesthesia Plan: General   Post-op Pain Management:    Induction: Intravenous  Airway Management Planned: Oral ETT and Video Laryngoscope Planned  Additional Equipment:   Intra-op Plan:   Post-operative Plan: Extubation in OR  Informed Consent: I have reviewed the patients History and Physical, chart, labs and discussed the procedure including the risks, benefits and alternatives for the proposed anesthesia with the patient or authorized representative who has indicated his/her understanding and acceptance.   Dental advisory given  Plan Discussed with: CRNA and Surgeon  Anesthesia Plan Comments:        Anesthesia Quick Evaluation

## 2012-09-12 NOTE — Progress Notes (Signed)
ANTIBIOTIC CONSULT NOTE - INITIAL  Pharmacy Consult for vancomycin Indication: surgical prophylaxis  Allergies  Allergen Reactions  . Erythromycin Nausea And Vomiting  . Penicillins Rash    Patient Measurements:   Adjusted Body Weight:   Vital Signs: Temp: 97 F (36.1 C) (05/06 1157) BP: 120/75 mmHg (05/06 1157) Pulse Rate: 84 (05/06 1157) Intake/Output from previous day:   Intake/Output from this shift: Total I/O In: 2200 [I.V.:2200] Out: 395 [Urine:225; Drains:20; Blood:150]  Labs:  Recent Labs  09/12/12 0636  WBC 10.3  HGB 14.7  PLT 245   CrCl is unknown because there is no height on file for the current visit. No results found for this basename: VANCOTROUGH, Leodis Binet, VANCORANDOM, GENTTROUGH, GENTPEAK, GENTRANDOM, TOBRATROUGH, TOBRAPEAK, TOBRARND, AMIKACINPEAK, AMIKACINTROU, AMIKACIN,  in the last 72 hours   Microbiology: Recent Results (from the past 720 hour(s))  SURGICAL PCR SCREEN     Status: None   Collection Time    09/07/12  2:08 PM      Result Value Range Status   MRSA, PCR NEGATIVE  NEGATIVE Final   Staphylococcus aureus NEGATIVE  NEGATIVE Final   Comment:            The Xpert SA Assay (FDA     approved for NASAL specimens     in patients over 31 years of age),     is one component of     a comprehensive surveillance     program.  Test performance has     been validated by [employer] for patients greater     than or equal to 62 year old.     It is not intended     to diagnose infection nor to     guide or monitor treatment.    Medical History: Past Medical History  Diagnosis Date  . PONV (postoperative nausea and vomiting)   . Heart palpitations   . Anxiety   . Diabetes mellitus without complication   . GERD (gastroesophageal reflux disease)   . Hypertension     Seen Dr. Earna Coder in [place]  . History of pneumonia     Medications:  Scheduled:  . bacitracin      . citalopram  20 mg Oral QHS  . [COMPLETED] dexamethasone  10 mg  Intravenous On Call to OR  . dexamethasone  4 mg Intravenous Q6H   Or  . dexamethasone  4 mg Oral Q6H  . insulin aspart  0-20 Units Subcutaneous TID WC  . insulin aspart  0-5 Units Subcutaneous QHS  . insulin aspart  4 Units Subcutaneous TID WC  . [COMPLETED] insulin aspart  4 Units Subcutaneous Once  . lisinopril-hydrochlorothiazide  1 tablet Oral QHS  . Lorcaserin HCl  1 tablet Oral BID  . metFORMIN  1,000 mg Oral QHS  . simvastatin  5 mg Oral q1800  . sodium chloride      . sodium chloride  3 mL Intravenous Q12H  . [COMPLETED] vancomycin      . [DISCONTINUED]  ceFAZolin (ANCEF) IV  2 g Intravenous On Call to OR  . [DISCONTINUED] vancomycin  1,500 mg Intravenous Once  . [DISCONTINUED] vancomycin  1,000 mg Intravenous Once   Infusions:  . 0.45 % NaCl with KCl 20 mEq / L    . sodium chloride     Assessment: 63 yo female s/p spinal surgery will be put on vancomycin for surgical prophylaxis; per the RN, patient does have a drain.  Patient received vancomycin  1g iv x1 at 0735 today.  SCr on 05/01 was 0.99 (CrCl >60)  Goal of Therapy:  Vancomycin trough level 15-20 mcg/ml  Plan:  1) Vancomycin 1g iv q12h, next dose at 1930 2) f/u plan on antibiotic before checking vancomycin trough  Jamari Diana, Tsz-Yin 09/12/2012,11:59 AM

## 2012-09-13 ENCOUNTER — Encounter (HOSPITAL_COMMUNITY): Payer: Self-pay | Admitting: Neurosurgery

## 2012-09-13 LAB — GLUCOSE, CAPILLARY
Glucose-Capillary: 209 mg/dL — ABNORMAL HIGH (ref 70–99)
Glucose-Capillary: 184 mg/dL — ABNORMAL HIGH (ref 70–99)

## 2012-09-13 MED ORDER — GABAPENTIN 600 MG PO TABS
300.0000 mg | ORAL_TABLET | Freq: Two times a day (BID) | ORAL | Status: DC
  Filled 2012-09-13 (×2): qty 0.5

## 2012-09-13 MED ORDER — GABAPENTIN 300 MG PO CAPS
300.0000 mg | ORAL_CAPSULE | Freq: Two times a day (BID) | ORAL | Status: DC
  Administered 2012-09-13 – 2012-09-14 (×3): 300 mg via ORAL
  Filled 2012-09-13 (×4): qty 1

## 2012-09-13 NOTE — Progress Notes (Signed)
UR COMPLETED  

## 2012-09-13 NOTE — Progress Notes (Signed)
Patient ID: Katrina Schultz, female   DOB: [birth year], 62 y.o.   MRN: [id number] Subjective: Patient reports some better, some worse  Objective: Vital signs in last 24 hours: Temp:  [97 F (36.1 C)-98.5 F (36.9 C)] 97.6 F (36.4 C) (05/07 1226) Pulse Rate:  [72-95] 72 (05/07 1226) Resp:  [16-18] 16 (05/07 1226) BP: (101-168)/(63-97) 105/64 mmHg (05/07 1226) SpO2:  [92 %-99 %] 96 % (05/07 1226)  Intake/Output from previous day: 05/06 0701 - 05/07 0700 In: 2880 [P.O.:480; I.V.:2200; IV Piggyback:200] Out: 680 [Urine:400; Drains:130; Blood:150] Intake/Output this shift:    Wound:clean and dry; some mild weakness of right intrinsics; some burning type pain of left distal arm  Lab Results:  Recent Labs  09/12/12 0636  WBC 10.3  HGB 14.7  HCT 41.1  PLT 245   BMET No results found for this basename: NA, K, CL, CO2, GLUCOSE, BUN, CREATININE, CALCIUM,  in the last 72 hours  Studies/Results: Dg Cervical Spine 2-3 Views  09/12/2012  *RADIOLOGY REPORT*  Clinical Data:C4-C7 fusion.  DG C-ARM 1-60 MIN,CERVICAL SPINE - 2-3 VIEW  Fluoroscopy Time: 12 seconds.  Comparison: None.  Findings: Two intraoperative C-arm views submitted for review after surgery.  Fusion C4-C7. Evaluation inferior to C4-5 level is limited by the patient's shoulders.  No obvious complication.  Follow up exam may be considered.  IMPRESSION: Fusion C4-C7.  Please see above.   Original Report Authenticated By: Lacy Duverney, M.D.    Dg C-arm 1-60 Min  09/12/2012  *RADIOLOGY REPORT*  Clinical Data:C4-C7 fusion.  DG C-ARM 1-60 MIN,CERVICAL SPINE - 2-3 VIEW  Fluoroscopy Time: 12 seconds.  Comparison: None.  Findings: Two intraoperative C-arm views submitted for review after surgery.  Fusion C4-C7. Evaluation inferior to C4-5 level is limited by the patient's shoulders.  No obvious complication.  Follow up exam may be considered.  IMPRESSION: Fusion C4-C7.  Please see above.   Original Report Authenticated By: Lacy Duverney,  M.D.     Assessment/Plan: Some dysesthetic type pain after spinal cord decompression. Her pre op radicular pain is much better. Will start some neurotin.   LOS: 1 day  as above   Reinaldo Meeker, MD 09/13/2012, 12:33 PM

## 2012-09-13 NOTE — Progress Notes (Signed)
Inpatient Diabetes Program Recommendations  AACE/ADA: New Consensus Statement on Inpatient Glycemic Control (2013)  Target Ranges:  Prepandial:   less than 140 mg/dL      Peak postprandial:   less than 180 mg/dL (1-2 hours)      Critically ill patients:  140 - 180 mg/dL   Inpatient Diabetes Program Recommendations HgbA1C: order to assess prehospital glucose control  Thank you  Onnika Siebel BSN, RN,CDE Inpatient Diabetes Coordinator [phone] (team pager)  

## 2012-09-14 LAB — GLUCOSE, CAPILLARY: Glucose-Capillary: 136 mg/dL — ABNORMAL HIGH (ref 70–99)

## 2012-09-14 MED ORDER — HYDROCODONE-ACETAMINOPHEN 5-325 MG PO TABS: 1.0000 | ORAL_TABLET | ORAL | Status: DC | PRN

## 2012-09-14 MED ORDER — CYCLOBENZAPRINE HCL 10 MG PO TABS: 10.0000 mg | ORAL_TABLET | Freq: Three times a day (TID) | ORAL | Status: DC | PRN

## 2012-09-14 MED ORDER — GABAPENTIN 300 MG PO CAPS: 300.0000 mg | ORAL_CAPSULE | Freq: Three times a day (TID) | ORAL | Status: DC

## 2012-09-14 NOTE — Discharge Summary (Signed)
Physician Discharge Summary  Patient ID: Katrina Schultz MRN: [id number] DOB/AGE: 06/05/1950 62 y.o.  Admit date: 09/12/2012 Discharge date: 09/14/2012  Admission Diagnoses:  Discharge Diagnoses:  Active Problems:   * No active hospital problems. 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 Did well post op. Some dysesthetic left arm pain after spinal decompression. Otherwise improved. Wound fine. Ambulated well. Home POD 2, specific instructions given.  Consults: None  Significant Diagnostic Studies: none  Treatments: surgery: C 45 C 56 C 67 acdf with plating  Discharge Exam: Blood pressure 103/72, pulse 70, temperature 97.4 F (36.3 C), temperature source Oral, resp. rate 20, SpO2 96.00%. Incision/Wound:clean and dry; mild left intrinsic weakness  Disposition: Final discharge disposition not confirmed  Discharge Orders   Future Orders Complete By Expires     Call MD for:  difficulty breathing, headache or visual disturbances  As directed     Call MD for:  hives  As directed     Call MD for:  persistant nausea and vomiting  As directed     Call MD for:  redness, tenderness, or signs of infection (pain, swelling, redness, odor or green/yellow discharge around incision site)  As directed     Call MD for:  severe uncontrolled pain  As directed     Call MD for:  temperature >100.4  As directed     Diet general  As directed     Discharge instructions  As directed     Comments:      Mostly bedrest. Get up 9 or 10 times each day and walk for 15-20 minutes each time. Very little sitting the first week. No riding in the car until your first post op appointment. If you had neck surgery...may shower from the chest down. If you had low back surgery....you may shower with a saran wrap covering over the incision. Take your pain medicine as needed...and other medicines that you are instructed to take. Call for an appointment...[phone].         Medication List    TAKE these medications       ACIDOPHILUS PO  Take 1 tablet by mouth at bedtime.     BELVIQ 10 MG Tabs  Generic drug:  Lorcaserin HCl  Take 1 tablet by mouth 2 (two) times daily.     citalopram 20 MG tablet  Commonly known as:  CELEXA  Take 20 mg by mouth at bedtime.     cyclobenzaprine 10 MG tablet  Commonly known as:  FLEXERIL  Take 1 tablet (10 mg total) by mouth 3 (three) times daily as needed for muscle spasms.     gabapentin 300 MG capsule  Commonly known as:  NEURONTIN  Take 1 capsule (300 mg total) by mouth 3 (three) times daily.     HYDROcodone-acetaminophen 5-325 MG per tablet  Commonly known as:  NORCO/VICODIN  Take 1-2 tablets by mouth every 4 (four) hours as needed.     HYDROcodone-acetaminophen 5-325 MG per tablet  Commonly known as:  NORCO/VICODIN  Take 1 tablet by mouth every 4 (four) hours as needed for pain. For pain     lisinopril-hydrochlorothiazide 10-12.5 MG per tablet  Commonly known as:  PRINZIDE,ZESTORETIC  Take 1 tablet by mouth at bedtime.     LORazepam 0.5 MG tablet  Commonly known as:  ATIVAN  Take 0.5 mg by mouth 4 (four) times daily as needed for anxiety. For anxiety     metFORMIN 1000  MG (MOD) 24 hr tablet  Commonly known as:  GLUMETZA  Take 1,000 mg by mouth at bedtime.     pravastatin 40 MG tablet  Commonly known as:  PRAVACHOL  Take 40 mg by mouth at bedtime.         At home rest most of the time. Get up 9 or 10 times each day and take a 15 or 20 minute walk. No riding in the car and to your first postoperative appointment. If you have neck surgery you may shower from the chest down starting on the third postoperative day. If you had back surgery he may start showering on the third postoperative day with saran wrap wrapped around your incisional area 3 times. After the shower remove the saran wrap. Take pain medicine as needed and other medications as instructed. Call my office for an  appointment.  SignedReinaldo Meeker, MD 09/14/2012, 10:12 AM

## 2014-12-31 IMAGING — RF DG CERVICAL SPINE 2 OR 3 VIEWS
1 series · 2 of 2 positions shown · non-contrast
Comparison: None.

CLINICAL DATA: C4-C7 fusion.

DG C-ARM 1-60 MIN,CERVICAL SPINE - 2-3 VIEW
Fluoroscopy Time: 12 seconds.

[Series 1: run · 2 of 2 slices shown]
[im 1/2]
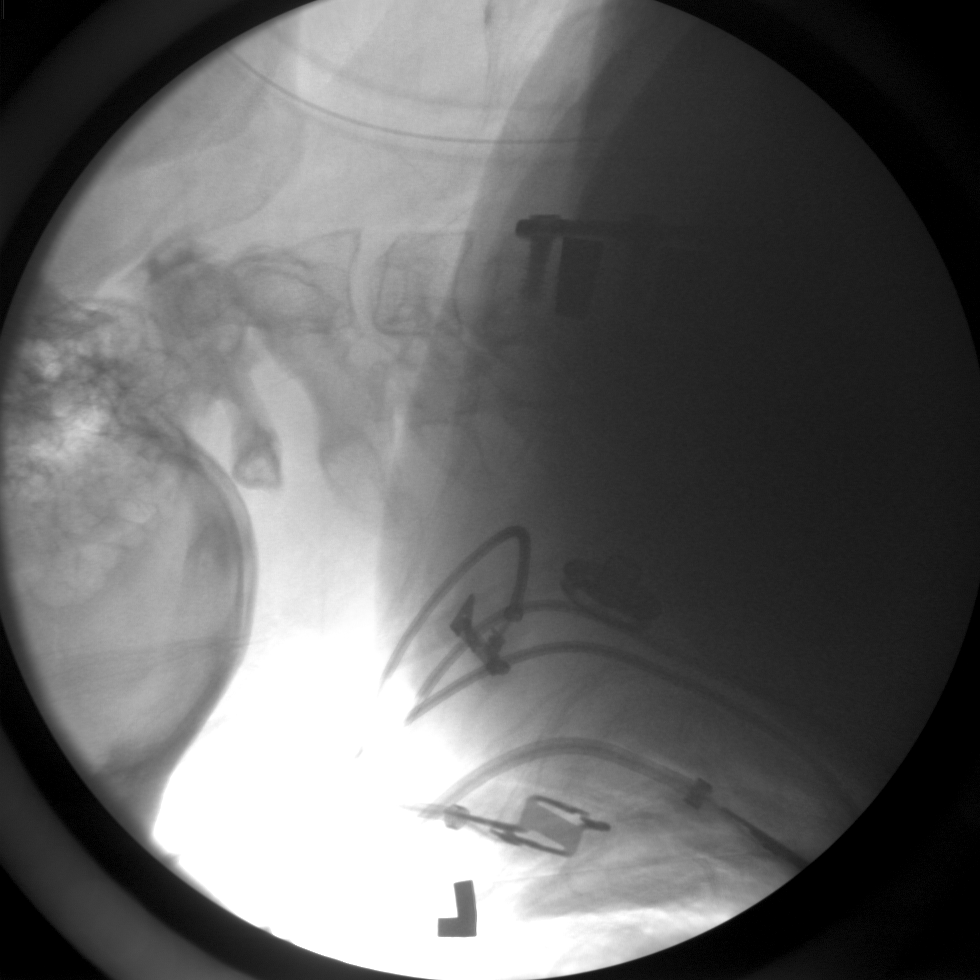
[im 2/2]
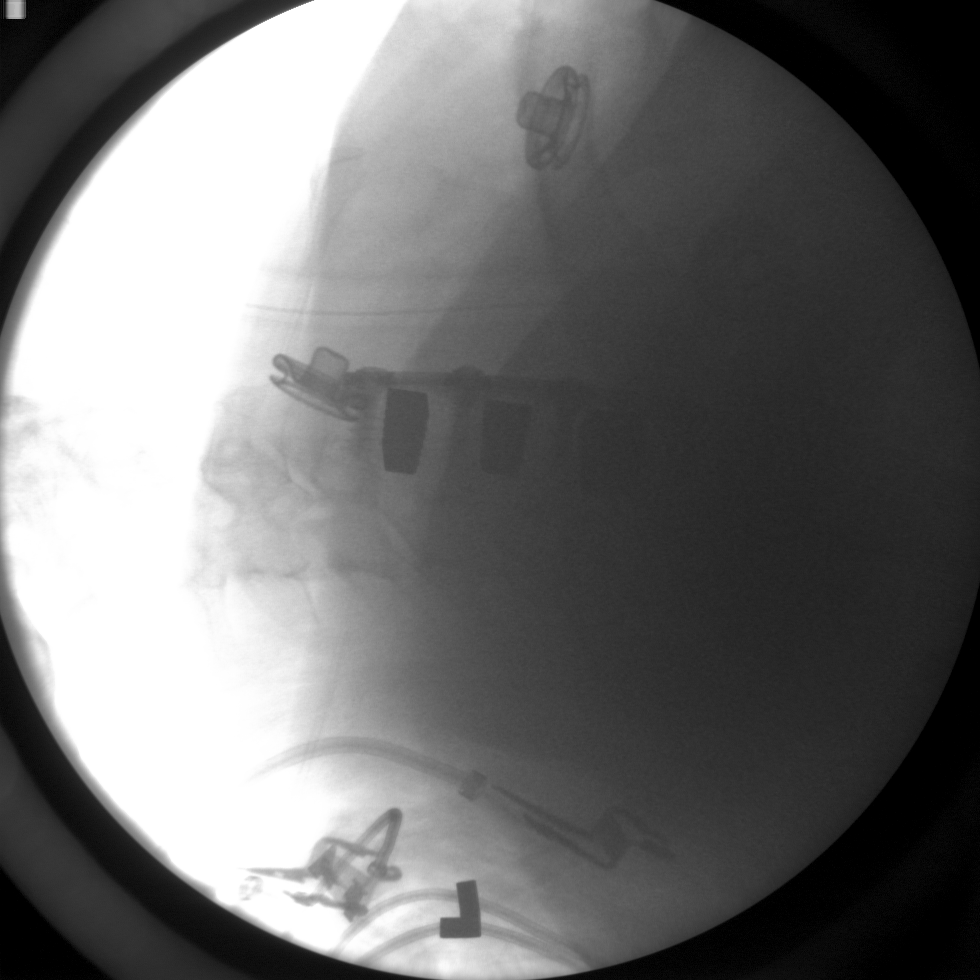

[2 of 2 positions shown; findings below may reference images not displayed]

FINDINGS: Two intraoperative C-arm views submitted for review after
surgery.

Fusion C4-C7. Evaluation inferior to C4-5 level is limited by the
patient's shoulders.  No obvious complication.  Follow up exam may
be considered.
IMPRESSION: Fusion C4-C7.  Please see above.

## 2017-08-18 ENCOUNTER — Telehealth: Payer: Self-pay | Admitting: Obstetrics and Gynecology

## 2017-08-18 NOTE — Telephone Encounter (Signed)
Called and left a message for patient to call back to schedule a new patient doctor referral appointment with our office for an AEX. °

## 2017-08-23 NOTE — Telephone Encounter (Signed)
Called and left a message for patient to call back to schedule a new patient doctor referral appointment with our office for an AEX. °

## 2017-08-25 ENCOUNTER — Ambulatory Visit: Payer: BLUE CROSS/BLUE SHIELD | Admitting: Obstetrics and Gynecology

## 2017-08-25 ENCOUNTER — Other Ambulatory Visit: Payer: Self-pay

## 2017-08-25 ENCOUNTER — Encounter: Payer: Self-pay | Admitting: Obstetrics and Gynecology

## 2017-08-25 VITALS — BP 118/70 | HR 72 | Resp 16 | Ht 62.5 in | Wt 249.0 lb

## 2017-08-25 DIAGNOSIS — N898 Other specified noninflammatory disorders of vagina: Secondary | ICD-10-CM | POA: Diagnosis not present

## 2017-08-25 DIAGNOSIS — N760 Acute vaginitis: Secondary | ICD-10-CM | POA: Diagnosis not present

## 2017-08-25 DIAGNOSIS — N3946 Mixed incontinence: Secondary | ICD-10-CM | POA: Diagnosis not present

## 2017-08-25 LAB — POCT URINALYSIS DIPSTICK
BILIRUBIN UA: NEGATIVE
Blood, UA: NEGATIVE
KETONES UA: NEGATIVE
Nitrite, UA: NEGATIVE
Protein, UA: NEGATIVE
pH, UA: 5 (ref 5.0–8.0)

## 2017-08-25 MED ORDER — BETAMETHASONE VALERATE 0.1 % EX OINT: TOPICAL_OINTMENT | CUTANEOUS | 0 refills | Status: AC

## 2017-08-25 MED ORDER — HYDROXYZINE HCL 10 MG PO TABS: 10.0000 mg | ORAL_TABLET | Freq: Three times a day (TID) | ORAL | 0 refills | Status: AC | PRN

## 2017-08-25 NOTE — Progress Notes (Signed)
67 y.o. G1P1001 WidowedCaucasianF here referred by Dr Norval Gable for a consultation for chronic vaginitis.  She c/o a bad vaginal infection for several weeks. In March her MD changed her diabetic medication and she started having significant vulvar symptoms. Her vulva is very sore, with burning and severe itching. She tried monistat 2 weeks ago, made it worse. Primary has given her diflucan, flagyl and macrobid. The redness has cleared up some (she is no longer bleeding from wiping externally). The itching and burning aren't any better.  Her primary also just started her on detrol for incontinence, it is helping some. She has mixed incontinence. She leaks multiple times a day, moderate amount (about 1/2 a cup). Changes her pad several times a day. She is 50% better with the detrol. Getting to the bathroom better, using less pads. Primary checked a urine culture and treated her with macrobid (she just finished it). She c/o frequent urination, but thinks it is from her diabetic medication.     No LMP recorded. Patient has had a hysterectomy.          Sexually active: No.  The current method of family planning is status post hysterectomy.    Exercising: No.  The patient does not participate in regular exercise at present. Smoker:  Yes E-Cig  Health Maintenance: Pap:  1982 History of abnormal Pap:  no MMG:  1987  Colonoscopy:  Never BMD:   Never TDaP:  unsure Gardasil: N/A   reports that she has been smoking e-cigarettes.  She has never used smokeless tobacco. She reports that she drinks about 0.6 - 1.2 oz of alcohol per week. She reports that she does not use drugs.  Past Medical History:  Diagnosis Date  . Anxiety   . Diabetes mellitus without complication (HCC)   . GERD (gastroesophageal reflux disease)   . Heart palpitations   . History of pneumonia   . Hypertension    Seen Dr. Earna Coder in [place]  . PONV (postoperative nausea and vomiting)     Past Surgical History:  Procedure  Laterality Date  . ABDOMINAL HYSTERECTOMY  1983  . ANTERIOR CERVICAL DECOMP/DISCECTOMY FUSION N/A 09/12/2012   Procedure: ANTERIOR CERVICAL DECOMPRESSION/DISCECTOMY FUSION 3 LEVELS;  Surgeon: Reinaldo Meeker, MD;  Location: [facility];  Service: Neurosurgery;  Laterality: N/A;  ANTERIOR CERVICAL FOUR-FIVE,FIVE-SIX,SIX SEVEN  . DILATION AND CURETTAGE OF UTERUS  1980's  . TONSILLECTOMY  1970    Current Outpatient Medications  Medication Sig Dispense Refill  . citalopram (CELEXA) 20 MG tablet Take 20 mg by mouth at bedtime.    . empagliflozin (JARDIANCE) 10 MG TABS tablet Take 10 mg by mouth daily.    . fluconazole (DIFLUCAN) 150 MG tablet Take 150 mg by mouth daily. Take every other day for 3 days    . lisinopril-hydrochlorothiazide (PRINZIDE,ZESTORETIC) 10-12.5 MG per tablet Take 1 tablet by mouth at bedtime.    Marland Kitchen LORazepam (ATIVAN) 0.5 MG tablet Take 0.5 mg by mouth 4 (four) times daily as needed for anxiety. For anxiety    . metFORMIN (GLUMETZA) 1000 MG (MOD) 24 hr tablet Take 1,000 mg by mouth at bedtime.    . metroNIDAZOLE (FLAGYL) 250 MG tablet     . nitrofurantoin, macrocrystal-monohydrate, (MACROBID) 100 MG capsule   0  . pravastatin (PRAVACHOL) 40 MG tablet Take 40 mg by mouth at bedtime.    . tolterodine (DETROL LA) 4 MG 24 hr capsule Take 4 mg by mouth daily.     No current facility-administered medications  for this visit.     History reviewed. No pertinent family history.  Review of Systems  Constitutional: Negative.   HENT: Negative.   Eyes: Negative.   Respiratory: Negative.   Cardiovascular: Negative.   Gastrointestinal: Negative.   Endocrine: Negative.   Genitourinary: Negative.        Vaginal itching and discharge   Musculoskeletal: Negative.   Skin: Negative.   Allergic/Immunologic: Negative.   Neurological: Negative.   Psychiatric/Behavioral: Negative.     Exam:   BP 118/70 (BP Location: Right Arm, Patient Position: Sitting, Cuff Size: Normal)   Pulse 72    Resp 16   Ht 5' 2.5" (1.588 m)   Wt 249 lb (112.9 kg)   BMI 44.82 kg/m   Weight change: @WEIGHTCHANGE @ Height:   Height: 5' 2.5" (158.8 cm)  Ht Readings from Last 3 Encounters:  08/25/17 5' 2.5" (1.588 m)  09/07/12 5\' 4"  (1.626 m)    General appearance: alert, cooperative and appears stated age Abdomen: soft, obese, mildly tender in the SP region; non distended,  no masses,  no organomegaly   Pelvic: External genitalia:  no lesions, erythematous with mild agglutination between the labia minora and majora.                Urethra:  normal appearing urethra with no masses, tenderness or lesions              Bartholins and Skenes: normal                 Vagina: erythematous, atrophic appearing vagina with an increase in watery vaginal discharge, no lesions              Cervix: absent               Bimanual Exam:  Uterus:  uterus absent              Adnexa: no mass, fullness, tenderness                  Chaperone was present for exam.  Wet prep: ? clue, no trich, +++ wbc KOH: no yeast PH: 5.5  A:  Vulvovaginitis, on flagyl, just finished diflucan. Slides with +++WBC and with ? Clue. She is at risk of yeast (not seen on vaginal slides)  Mixed urinary incontinence, still with WBC on dip (could be contaminant, just finished macrobid). 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

## 2017-08-25 NOTE — Patient Instructions (Signed)

## 2017-08-26 LAB — URINALYSIS, MICROSCOPIC ONLY

## 2017-08-26 LAB — URINE CULTURE

## 2017-08-27 LAB — NUSWAB VAGINITIS (VG)
CANDIDA GLABRATA, NAA: NEGATIVE
Candida albicans, NAA: NEGATIVE
TRICH VAG BY NAA: NEGATIVE

## 2017-08-30 ENCOUNTER — Telehealth: Payer: Self-pay | Admitting: *Deleted

## 2017-08-30 NOTE — Telephone Encounter (Signed)
Left message to call regarding lab results -eh 

## 2017-08-30 NOTE — Telephone Encounter (Signed)
-----   Message from Romualdo BolkJill Evelyn Jertson, MD sent at 08/29/2017  5:22 PM EDT ----- Please inform the patient that her vaginitis panel was negative for infection. She does have group B strep in her urine and some blood. GBS can be a contaminant from the vagina, but given her incontinence, I think we need to figure out if this is a contaminant. Please have her come in for a st cath ua, will send for micro ua, c&S.

## 2017-08-30 NOTE — Telephone Encounter (Signed)
Spoke with patient and gave results and recommendations. Patient is scheduled for 2 weeks for follow up and Cath-eh

## 2017-08-30 NOTE — Telephone Encounter (Signed)
[place].

## 2017-09-13 ENCOUNTER — Ambulatory Visit: Payer: BLUE CROSS/BLUE SHIELD | Admitting: Obstetrics and Gynecology

## 2017-09-13 ENCOUNTER — Other Ambulatory Visit: Payer: Self-pay

## 2017-09-13 ENCOUNTER — Encounter: Payer: Self-pay | Admitting: Obstetrics and Gynecology

## 2017-09-13 VITALS — BP 118/62 | HR 84 | Resp 16 | Wt 243.0 lb

## 2017-09-13 DIAGNOSIS — N39498 Other specified urinary incontinence: Secondary | ICD-10-CM

## 2017-09-13 DIAGNOSIS — N9089 Other specified noninflammatory disorders of vulva and perineum: Secondary | ICD-10-CM

## 2017-09-13 NOTE — Progress Notes (Signed)
GYNECOLOGY  VISIT   HPI: 67 y.o.   Widowed  Caucasian  female   G1P1001 with No LMP recorded. Patient has had a hysterectomy.   here for follow up vulvovaginitis. She had extensive WBC on vaginal slides, nuswab testing was negative. She was treated with topical steroids and atarax. She feels all better. She states her diabetes was out of control last time she was here. She is working on her control. She has mixed incontinence, her primary started her on detrol a few weeks ago. The detrol has helped a lot. Still leaks with cough or sneeze, currently not leaking on the way to the bathroom.  Urine culture from 4/18 grew 25K-50K GBS.       GYNECOLOGIC HISTORY: No LMP recorded. Patient has had a hysterectomy. Contraception: hysterectomy  Menopausal hormone therapy: none         OB History    Gravida  1   Para  1   Term  1   Preterm      AB      Living  1     SAB      TAB      Ectopic      Multiple      Live Births  1              There are no active problems to display for this patient.   Past Medical History:  Diagnosis Date  . Anxiety   . Depression   . Diabetes mellitus without complication (HCC)   . GERD (gastroesophageal reflux disease)   . Heart palpitations   . History of pneumonia   . Hypertension    Seen Dr. Earna Coder in [place]  . PONV (postoperative nausea and vomiting)   . Urinary incontinence     Past Surgical History:  Procedure Laterality Date  . ABDOMINAL HYSTERECTOMY  1983  . ANTERIOR CERVICAL DECOMP/DISCECTOMY FUSION N/A 09/12/2012   Procedure: ANTERIOR CERVICAL DECOMPRESSION/DISCECTOMY FUSION 3 LEVELS;  Surgeon: Reinaldo Meeker, MD;  Location: [facility];  Service: Neurosurgery;  Laterality: N/A;  ANTERIOR CERVICAL FOUR-FIVE,FIVE-SIX,SIX SEVEN  . DILATION AND CURETTAGE OF UTERUS  1980's  . TONSILLECTOMY  1970    Current Outpatient Medications  Medication Sig Dispense Refill  . betamethasone valerate ointment (VALISONE) 0.1 % Apply a small  amount topically BID for 1-2 weeks as needed. Not for daily long term use 15 g 0  . citalopram (CELEXA) 20 MG tablet Take 20 mg by mouth at bedtime.    . empagliflozin (JARDIANCE) 10 MG TABS tablet Take 10 mg by mouth daily.    . hydrOXYzine (ATARAX/VISTARIL) 10 MG tablet Take 1 tablet (10 mg total) by mouth 3 (three) times daily as needed. 30 tablet 0  . lisinopril-hydrochlorothiazide (PRINZIDE,ZESTORETIC) 10-12.5 MG per tablet Take 1 tablet by mouth at bedtime.    Marland Kitchen LORazepam (ATIVAN) 0.5 MG tablet Take 0.5 mg by mouth 4 (four) times daily as needed for anxiety. For anxiety    . metFORMIN (GLUMETZA) 1000 MG (MOD) 24 hr tablet Take 1,000 mg by mouth at bedtime.    . pravastatin (PRAVACHOL) 40 MG tablet Take 40 mg by mouth at bedtime.    . tolterodine (DETROL LA) 4 MG 24 hr capsule Take 4 mg by mouth daily.     No current facility-administered medications for this visit.      ALLERGIES: Erythromycin and Penicillins  Family History  Problem Relation Age of Onset  . Diabetes Mother   . Heart  attack Father     Social History   Socioeconomic History  . Marital status: Widowed    Spouse name: Not on file  . Number of children: Not on file  . Years of education: Not on file  . Highest education level: Not on file  Occupational History  . Not on file  Social Needs  . Financial resource strain: Not on file  . Food insecurity:    Worry: Not on file    Inability: Not on file  . Transportation needs:    Medical: Not on file    Non-medical: Not on file  Tobacco Use  . Smoking status: Current Every Day Smoker    Types: E-cigarettes  . Smokeless tobacco: Never Used  . Tobacco comment: Varied amount of cig. use had quit for 10 years occ use of E- cigarette  Substance and Sexual Activity  . Alcohol use: Yes    Alcohol/week: 0.6 - 1.2 oz    Types: 1 - 2 Standard drinks or equivalent per week  . Drug use: No  . Sexual activity: Not Currently  Lifestyle  . Physical activity:    Days  per week: Not on file    Minutes per session: Not on file  . Stress: Not on file  Relationships  . Social connections:    Talks on phone: Not on file    Gets together: Not on file    Attends religious service: Not on file    Active member of club or organization: Not on file    Attends meetings of clubs or organizations: Not on file    Relationship status: Not on file  . Intimate partner violence:    Fear of current or ex partner: Not on file    Emotionally abused: Not on file    Physically abused: Not on file    Forced sexual activity: Not on file  Other Topics Concern  . Not on file  Social History Narrative  . Not on file    Review of Systems  Constitutional: Negative.   HENT: Negative.   Eyes: Negative.   Respiratory: Negative.   Cardiovascular: Negative.   Gastrointestinal: Negative.   Genitourinary:       Loss of urine with sneeze / cough  Musculoskeletal: Negative.   Skin: Negative.   Neurological: Negative.   Endo/Heme/Allergies: Negative.   Psychiatric/Behavioral: Negative.     PHYSICAL EXAMINATION:    BP 118/62 (BP Location: Right Arm, Patient Position: Sitting, Cuff Size: Normal)   Pulse 84   Resp 16   Wt 243 lb (110.2 kg)   BMI 43.74 kg/m     General appearance: alert, cooperative and appears stated age  ASSESSMENT Severe vulvitis, resolved with steroid ointment, negative nuswab Mixed incontinence, better with detrol, still with GSI.  Urine from a few weeks ago with GBS    PLAN Discussed vulvar skin care, use vaseline as needed Return with recurrent vulvitis symptoms Check ccua for ua, c&s. Discussed that UTI's can present with incontinence and the last sample was possibly contaminated.    An After Visit Summary was printed and given to the patient.   CC: Dr Norval Gable

## 2017-09-14 LAB — URINALYSIS, MICROSCOPIC ONLY: CASTS: NONE SEEN /LPF

## 2017-09-15 LAB — URINE CULTURE

## 2017-09-16 ENCOUNTER — Telehealth: Payer: Self-pay | Admitting: *Deleted

## 2017-09-16 NOTE — Telephone Encounter (Signed)
Spoke with patient and gave results and recommendations. Patient states she is feeling so much better. She will call if her symptoms come back -eh

## 2017-09-16 NOTE — Telephone Encounter (Signed)
Left message to call regarding lab results -eh 

## 2017-09-16 NOTE — Telephone Encounter (Signed)
-----   Message from [name], MD sent at 09/15/2017  5:14 PM EDT ----- Please inform the patient that she still has GBS in her urine, but not to the level that is typically considered a UTI. She also has epithelial cells in her urinalysis which can go along with contamination. I was checking because of her incontinence, but given the low level of bacteria, the epithelial cells and her PCN allergy (which would necessitate a strong antibiotic), I will not treat. If she is having any new symptoms she should come in for a straight cath urine.
